# Patient Record
Sex: Female | Born: 1960 | Hispanic: Yes | Marital: Single | Smoking: Never smoker
Health system: Southern US, Community
[De-identification: ages and names within clinical notes are randomized; demographics above are authoritative.]

## PROBLEM LIST (undated history)

## (undated) DIAGNOSIS — E785 Hyperlipidemia, unspecified: Secondary | ICD-10-CM

## (undated) DIAGNOSIS — I10 Essential (primary) hypertension: Secondary | ICD-10-CM

## (undated) DIAGNOSIS — F419 Anxiety disorder, unspecified: Secondary | ICD-10-CM

## (undated) DIAGNOSIS — G43909 Migraine, unspecified, not intractable, without status migrainosus: Secondary | ICD-10-CM

## (undated) HISTORY — DX: Anxiety disorder, unspecified: F41.9

## (undated) HISTORY — DX: Hyperlipidemia, unspecified: E78.5

## (undated) HISTORY — DX: Migraine, unspecified, not intractable, without status migrainosus: G43.909

## (undated) HISTORY — DX: Essential (primary) hypertension: I10

---

## 2017-05-02 ENCOUNTER — Encounter: Payer: Self-pay | Admitting: Internal Medicine

## 2017-05-02 ENCOUNTER — Ambulatory Visit: Payer: Self-pay | Admitting: Internal Medicine

## 2017-05-02 VITALS — BP 150/98 | HR 79 | Resp 16 | Ht 62.0 in | Wt 172.0 lb

## 2017-05-02 DIAGNOSIS — G43909 Migraine, unspecified, not intractable, without status migrainosus: Secondary | ICD-10-CM | POA: Insufficient documentation

## 2017-05-02 DIAGNOSIS — E782 Mixed hyperlipidemia: Secondary | ICD-10-CM

## 2017-05-02 DIAGNOSIS — F419 Anxiety disorder, unspecified: Secondary | ICD-10-CM | POA: Insufficient documentation

## 2017-05-02 DIAGNOSIS — I10 Essential (primary) hypertension: Secondary | ICD-10-CM | POA: Insufficient documentation

## 2017-05-02 DIAGNOSIS — J3089 Other allergic rhinitis: Secondary | ICD-10-CM

## 2017-05-02 MED ORDER — LISINOPRIL 20 MG PO TABS: 20.0000 mg | ORAL_TABLET | Freq: Every day | ORAL | 11 refills | Status: DC

## 2017-05-02 MED ORDER — ESCITALOPRAM OXALATE 10 MG PO TABS: 10.0000 mg | ORAL_TABLET | Freq: Every day | ORAL | 2 refills | Status: DC

## 2017-05-02 MED ORDER — METOPROLOL TARTRATE 25 MG PO TABS: 25.0000 mg | ORAL_TABLET | Freq: Two times a day (BID) | ORAL | 11 refills | Status: DC
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
## 2017-05-03 ENCOUNTER — Encounter: Payer: Self-pay | Admitting: Internal Medicine

## 2017-05-03 NOTE — Progress Notes (Signed)
Patient ID: Markus JarvisCiria Pineda Davalos, female   DOB: 09/24/1960, 57 y.o.   MRN: [id number]  LCSW completed new pt screening with pt. Pt reported that she has multiple depressive and anxious symptoms, including anhedonia, depressed mood, insomnia, fatigue, overeating, and stress. She reported that she has some difficulty with social determinants of health but nothing that is urgent. Tianna scheduled counseling session with LCSW to address mental health concerns.

## 2017-05-11 ENCOUNTER — Other Ambulatory Visit: Payer: Self-pay | Admitting: Licensed Clinical Social Worker

## 2017-05-13 ENCOUNTER — Other Ambulatory Visit: Payer: Self-pay

## 2017-05-13 DIAGNOSIS — Z79899 Other long term (current) drug therapy: Secondary | ICD-10-CM

## 2017-05-13 DIAGNOSIS — E782 Mixed hyperlipidemia: Secondary | ICD-10-CM

## 2017-05-14 LAB — LIPID PANEL W/O CHOL/HDL RATIO
CHOLESTEROL TOTAL: 208 mg/dL — AB (ref 100–199)
HDL: 40 mg/dL (ref >39)
LDL Calculated: 123 mg/dL — ABNORMAL HIGH (ref 0–99)
Triglycerides: 226 mg/dL — ABNORMAL HIGH (ref 0–149)
VLDL CHOLESTEROL CAL: 45 mg/dL — AB (ref 5–40)

## 2017-05-14 LAB — COMPREHENSIVE METABOLIC PANEL
A/G RATIO: 1.8 (ref 1.2–2.2)
ALBUMIN: 4.5 g/dL (ref 3.5–5.5)
ALT: 26 IU/L (ref 0–32)
AST: 29 IU/L (ref 0–40)
Alkaline Phosphatase: 88 IU/L (ref 39–117)
BUN / CREAT RATIO: 14 (ref 9–23)
BUN: 12 mg/dL (ref 6–24)
Bilirubin Total: 0.6 mg/dL (ref 0.0–1.2)
CALCIUM: 9.4 mg/dL (ref 8.7–10.2)
CO2: 22 mmol/L (ref 20–29)
Chloride: 106 mmol/L (ref 96–106)
Creatinine, Ser: 0.84 mg/dL (ref 0.57–1.00)
GFR, EST AFRICAN AMERICAN: 91 mL/min/{1.73_m2} (ref >59)
GFR, EST NON AFRICAN AMERICAN: 79 mL/min/{1.73_m2} (ref >59)
GLOBULIN, TOTAL: 2.5 g/dL (ref 1.5–4.5)
Glucose: 87 mg/dL (ref 65–99)
POTASSIUM: 4.3 mmol/L (ref 3.5–5.2)
SODIUM: 143 mmol/L (ref 134–144)
Total Protein: 7 g/dL (ref 6.0–8.5)

## 2017-05-14 LAB — CBC WITH DIFFERENTIAL/PLATELET
BASOS: 0 %
Basophils Absolute: 0 10*3/uL (ref 0.0–0.2)
EOS (ABSOLUTE): 0.1 10*3/uL (ref 0.0–0.4)
Eos: 2 %
HEMATOCRIT: 40 % (ref 34.0–46.6)
Hemoglobin: 13 g/dL (ref 11.1–15.9)
Immature Grans (Abs): 0 10*3/uL (ref 0.0–0.1)
Immature Granulocytes: 0 %
LYMPHS ABS: 3.5 10*3/uL — AB (ref 0.7–3.1)
Lymphs: 52 %
MCH: 30.2 pg (ref 26.6–33.0)
MCHC: 32.5 g/dL (ref 31.5–35.7)
MCV: 93 fL (ref 79–97)
MONOS ABS: 0.6 10*3/uL (ref 0.1–0.9)
Monocytes: 9 %
NEUTROS ABS: 2.6 10*3/uL (ref 1.4–7.0)
NEUTROS PCT: 37 %
PLATELETS: 233 10*3/uL (ref 150–379)
RBC: 4.3 x10E6/uL (ref 3.77–5.28)
RDW: 14.1 % (ref 12.3–15.4)
WBC: 6.9 10*3/uL (ref 3.4–10.8)

## 2017-05-16 ENCOUNTER — Telehealth: Payer: Self-pay | Admitting: Internal Medicine

## 2017-09-19 ENCOUNTER — Other Ambulatory Visit: Payer: Self-pay

## 2017-09-29 ENCOUNTER — Other Ambulatory Visit: Payer: Self-pay

## 2017-09-29 DIAGNOSIS — E782 Mixed hyperlipidemia: Secondary | ICD-10-CM

## 2017-09-30 LAB — LIPID PANEL W/O CHOL/HDL RATIO
CHOLESTEROL TOTAL: 173 mg/dL (ref 100–199)
HDL: 36 mg/dL — ABNORMAL LOW (ref >39)
LDL Calculated: 97 mg/dL (ref 0–99)
Triglycerides: 202 mg/dL — ABNORMAL HIGH (ref 0–149)
VLDL CHOLESTEROL CAL: 40 mg/dL (ref 5–40)

## 2017-11-11 ENCOUNTER — Other Ambulatory Visit: Payer: Self-pay

## 2017-11-11 ENCOUNTER — Encounter (HOSPITAL_COMMUNITY): Payer: Self-pay | Admitting: Emergency Medicine

## 2017-11-11 ENCOUNTER — Emergency Department (HOSPITAL_COMMUNITY)
Admission: EM | Admit: 2017-11-11 | Discharge: 2017-11-11 | Disposition: A | Discharge status: Home / Self Care | Payer: Self-pay | Attending: Emergency Medicine | Admitting: Emergency Medicine

## 2017-11-11 ENCOUNTER — Emergency Department (HOSPITAL_COMMUNITY): Payer: Self-pay

## 2017-11-11 DIAGNOSIS — J40 Bronchitis, not specified as acute or chronic: Secondary | ICD-10-CM | POA: Insufficient documentation

## 2017-11-11 DIAGNOSIS — R079 Chest pain, unspecified: Secondary | ICD-10-CM | POA: Insufficient documentation

## 2017-11-11 DIAGNOSIS — I10 Essential (primary) hypertension: Secondary | ICD-10-CM | POA: Insufficient documentation

## 2017-11-11 DIAGNOSIS — Z79899 Other long term (current) drug therapy: Secondary | ICD-10-CM | POA: Insufficient documentation

## 2017-11-11 DIAGNOSIS — R059 Cough, unspecified: Secondary | ICD-10-CM

## 2017-11-11 DIAGNOSIS — R05 Cough: Secondary | ICD-10-CM

## 2017-11-11 DIAGNOSIS — Z23 Encounter for immunization: Secondary | ICD-10-CM | POA: Insufficient documentation

## 2017-11-11 LAB — BASIC METABOLIC PANEL
Anion gap: 6 (ref 5–15)
BUN: 10 mg/dL (ref 6–20)
CALCIUM: 8.8 mg/dL — AB (ref 8.9–10.3)
CHLORIDE: 110 mmol/L (ref 98–111)
CO2: 22 mmol/L (ref 22–32)
CREATININE: 0.9 mg/dL (ref 0.44–1.00)
Glucose, Bld: 152 mg/dL — ABNORMAL HIGH (ref 70–99)
Potassium: 3.5 mmol/L (ref 3.5–5.1)
SODIUM: 138 mmol/L (ref 135–145)

## 2017-11-11 LAB — I-STAT BETA HCG BLOOD, ED (NOT ORDERABLE)

## 2017-11-11 LAB — CBC
HCT: 37.8 % (ref 36.0–46.0)
Hemoglobin: 13.1 g/dL (ref 12.0–15.0)
MCH: 31.9 pg (ref 26.0–34.0)
MCHC: 34.7 g/dL (ref 30.0–36.0)
MCV: 92 fL (ref 78.0–100.0)
PLATELETS: 195 10*3/uL (ref 150–400)
RBC: 4.11 MIL/uL (ref 3.87–5.11)
RDW: 12.6 % (ref 11.5–15.5)
WBC: 5.9 10*3/uL (ref 4.0–10.5)

## 2017-11-11 LAB — INFLUENZA PANEL BY PCR (TYPE A & B)
Influenza A By PCR: NEGATIVE
Influenza B By PCR: NEGATIVE

## 2017-11-11 LAB — POCT I-STAT TROPONIN I: TROPONIN I, POC: 0 ng/mL (ref 0.00–0.08)

## 2017-11-11 MED ORDER — AZITHROMYCIN 250 MG PO TABS: 250.0000 mg | ORAL_TABLET | Freq: Every day | ORAL | 0 refills | Status: DC

## 2017-11-11 MED ORDER — ALBUTEROL SULFATE HFA 108 (90 BASE) MCG/ACT IN AERS
2.0000 | INHALATION_SPRAY | Freq: Once | RESPIRATORY_TRACT | Status: AC
  Administered 2017-11-11: 2 via RESPIRATORY_TRACT
  Filled 2017-11-11: qty 6.7

## 2017-11-11 MED ORDER — PREDNISONE 20 MG PO TABS: ORAL_TABLET | ORAL | 0 refills | Status: DC

## 2017-11-11 MED ORDER — AZITHROMYCIN 250 MG PO TABS
500.0000 mg | ORAL_TABLET | Freq: Once | ORAL | Status: AC
  Administered 2017-11-11: 500 mg via ORAL
  Filled 2017-11-11: qty 2

## 2017-11-11 MED ORDER — PREDNISONE 50 MG PO TABS
60.0000 mg | ORAL_TABLET | Freq: Once | ORAL | Status: AC
  Administered 2017-11-11: 60 mg via ORAL
  Filled 2017-11-11: qty 1

## 2017-11-11 NOTE — ED Triage Notes (Signed)
Pt c/o cough since yesterday, pt started having sob/chest pressure and gen weakness today. A/o. Non diaphoretic.

## 2017-11-11 NOTE — ED Notes (Signed)
Signs and symptoms improved after medications. No new complaints or concerns verbalized. Pt pain free. Pt awaiting plan of care.
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
## 2017-11-30 ENCOUNTER — Ambulatory Visit: Payer: Self-pay | Admitting: Internal Medicine

## 2017-11-30 ENCOUNTER — Encounter: Payer: Self-pay | Admitting: Internal Medicine

## 2017-11-30 VITALS — BP 130/90 | HR 86 | Resp 12 | Ht 62.0 in | Wt 180.0 lb

## 2017-11-30 DIAGNOSIS — R05 Cough: Secondary | ICD-10-CM

## 2017-11-30 DIAGNOSIS — I10 Essential (primary) hypertension: Secondary | ICD-10-CM

## 2017-11-30 DIAGNOSIS — F419 Anxiety disorder, unspecified: Secondary | ICD-10-CM

## 2017-11-30 DIAGNOSIS — R059 Cough, unspecified: Secondary | ICD-10-CM

## 2017-11-30 MED ORDER — ESCITALOPRAM OXALATE 10 MG PO TABS: ORAL_TABLET | ORAL | 2 refills | Status: DC

## 2017-11-30 MED ORDER — LOSARTAN POTASSIUM 100 MG PO TABS: 100.0000 mg | ORAL_TABLET | Freq: Every day | ORAL | 11 refills | Status: DC

## 2017-11-30 MED ORDER — METOPROLOL TARTRATE 25 MG PO TABS: 25.0000 mg | ORAL_TABLET | Freq: Two times a day (BID) | ORAL | 0 refills | Status: DC

## 2017-11-30 NOTE — Progress Notes (Signed)
   Subjective:    Patient ID: Vanessa Mcdaniel, female    DOB: 17-Jan-1961, 57 y.o.   MRN: [id number]  HPI   Daughter interprets--difficult to clarify situation, so asked Trinda Pascal to help out.  1.  Patient seen in ED at [facility] in [place] on 11/11/2017 for cough.  Was diagnosed with acute bronchitis and treated with prednisone burst and Zpack. Chest Xray showed cardiomegaly, but no pulmonary abnormality.   Took Prednisone only for 2 days as she felt it made her dizzy and finished the Zpack.  Not clear, but sounds like she felt she had a fever with this illness.  Patient states she now has what sound like paroxysms of cough.  Difficulty clarifying, but sounds like she had this about a month before her illness that took her to the ED.  This is the same cough she is now having since treatment for her bronchitis.   She has been taking Lisinopril for some time, I refilled in March.  2.  Panic Disorder: did not followup after start of Escitalopram, started in March as well.  States it made her sleepy, so she started taking at bedtime.  Stopped taking after first month.  States she was afraid she would come to depend on it for sleep.  Was on sleep medicine in past for many years.   She is still having problems with anxiety, she cannot say if that is more of her problem than panic disorder.  Current Meds  Medication Sig  . [DISCONTINUED] lisinopril (PRINIVIL,ZESTRIL) 20 MG tablet Take 1 tablet (20 mg total) by mouth daily.   No Known Allergies      Review of Systems     Objective:   Physical Exam  NAD HEENT:  PERRL, EOMI, TMs pearly gray--points to pain behind right ear--tender over right sternocleidomastoid.  Throat without injection Neck:  Supple, No adenopathy.  Chest:  CTA CV:  RRR without murmur or rub.  Radial and DP pulses normal and equal. LE:  No edema.        Assessment & Plan:  1.  Cough:  Appears to be ACE I cough and she had a separate respiratory illness  at beginning of month.  Stop Lisinopril and switch to Losartan.  2.  Hypertension:  Switch to Losartan 100 mg daily.  Follow up in 2 weeks for bp, cough and anxiety.  Check BMP then.  3.  Anxiety/panic disorder and suspect depression as well:  Restart Escitalopram 10 mg at bedtime.  Follow up as above.

## 2017-12-14 ENCOUNTER — Encounter: Payer: Self-pay | Admitting: Internal Medicine

## 2017-12-14 ENCOUNTER — Ambulatory Visit: Payer: Self-pay | Admitting: Internal Medicine

## 2017-12-14 VITALS — BP 130/82 | HR 82 | Resp 12 | Ht 62.0 in | Wt 180.0 lb

## 2017-12-14 DIAGNOSIS — I10 Essential (primary) hypertension: Secondary | ICD-10-CM

## 2017-12-14 DIAGNOSIS — R059 Cough, unspecified: Secondary | ICD-10-CM

## 2017-12-14 DIAGNOSIS — F419 Anxiety disorder, unspecified: Secondary | ICD-10-CM

## 2017-12-14 DIAGNOSIS — R739 Hyperglycemia, unspecified: Secondary | ICD-10-CM

## 2017-12-14 DIAGNOSIS — R05 Cough: Secondary | ICD-10-CM

## 2017-12-14 DIAGNOSIS — R0789 Other chest pain: Secondary | ICD-10-CM

## 2017-12-14 MED ORDER — NITROGLYCERIN 0.4 MG SL SUBL: 0.4000 mg | SUBLINGUAL_TABLET | SUBLINGUAL | 0 refills | Status: DC | PRN

## 2017-12-14 MED ORDER — METOPROLOL TARTRATE 25 MG PO TABS: ORAL_TABLET | ORAL | 11 refills | Status: DC

## 2017-12-14 MED ORDER — ASPIRIN EC 81 MG PO TBEC: 81.0000 mg | DELAYED_RELEASE_TABLET | Freq: Every day | ORAL | Status: AC

## 2017-12-14 NOTE — Progress Notes (Signed)
   Subjective:    Patient ID: Vanessa Mcdaniel, female    DOB: 1961-01-13, 57 y.o.   MRN: [id number]  HPI   1.  Cough:  Stopped Lisinopril and is tolerating the Losartan just fine.  Cough much better starting yesterday.  2.  Hypertension:  Improved control on Losartan.    3.  Anxiety/panic disorder:  Taking Escitalopram 10 mg for about 2 weeks.  Has not noted much of a change with anxiety.  Has helped with sleep.  Takes prior to bedtime.  Has felt more rested in the morning.  States she really doesn't have episodes where she is having attacks of anxiety, just always feels anxious.  4.  Episode 2 p.m today with left flank and thoracic back pain that radiated up into her left chest, right chin, not the left and left arm.  She was sweeping with a broom and dustpan.   Was standing straight, felt the pain in her left back that felt like a pressure.  Unable to describe the pain into her chest--maybe also a pressure and right chin. Her left arm just felt heavy, not painful.   Discomfort lasted 5 minutes.  Took an aspirin and drank water and just kept moving her left hand.   No associated dyspnea.   After went away, felt normal.  Was just nervous because she wasn't sure what it was.  She was able to return to her sweeping after resolved.  She continued to work cooking and cleaning afterward.  She was quite busy afterward and the discomfort did not recur.    Current Meds  Medication Sig  . escitalopram (LEXAPRO) 10 MG tablet 1 tab by mouth at bedtime  . losartan (COZAAR) 100 MG tablet Take 1 tablet (100 mg total) by mouth daily.  . metoprolol tartrate (LOPRESSOR) 25 MG tablet Take 1 tablet (25 mg total) by mouth 2 (two) times daily.    No Known Allergies Review of Systems     Objective:   Physical Exam NAD HEENT: PERRL, EOMI, TMs pearly gray, throat without injection Neck:  Supple, No adenopathy, No thyromegaly Chest:  CTA.  NT on palpation of left back and chest. CV:  RRR with normal S1  and S2, No S3, S4 or murmur.  No carotid bruits.  Carotid, radial and DP pulses normal and equal LE:  No edema.  ECG:  No changes from 1 month ago.  Essentially normal.  No ischemic changes in particular.     Assessment & Plan:  1.  Anxiety:  History more of a generalized anxiety today.  Continue Escitalopram at 10 mg daily and discussed may be 6 weeks before she notes a difference, though suspect will need a 20 mg dosing ultimately.  2.  ACE I cough:  Sounds resolved.  3.  Hypertension:  Okay with switch to Losartan 100 mg daily.  Continues also on Metoprolol 25 mg twice daily--not clear if she is taking the latter regularly as her pulse is unchanged.  4.  Chest discomfort:  By history, unlikely cardiac, but she does have risk factors.  No family history of Cardiac disease per patient. ECG unremarkable. Cardiac enzymes NTG SL as needed ASA 81 mg daily. Encouraged her not to miss her Metoprolol twice daily. If normal findings, followup in 1 week for bp and pulse and progress report.  5.  Hyperglycemia:  A1C

## 2017-12-14 NOTE — Patient Instructions (Signed)
For nitroglycerin:  May take 1 tab under tongue every 5 minutes for chest pain.  Do not repeat if chest pain resolves, but may take up to 3 doses 5 minutes apart if the pain does not (this includes the chest pressure type pain)

## 2017-12-15 LAB — CK TOTAL AND CKMB (NOT AT ARMC)
CK MB INDEX: 1.3 ng/mL (ref 0.0–5.3)
Total CK: 174 U/L — ABNORMAL HIGH (ref 24–173)

## 2017-12-15 LAB — HGB A1C W/O EAG: Hgb A1c MFr Bld: 5.3 % (ref 4.8–5.6)

## 2018-01-11 ENCOUNTER — Ambulatory Visit: Payer: Self-pay | Admitting: Internal Medicine

## 2018-02-05 ENCOUNTER — Encounter: Payer: Self-pay | Admitting: Internal Medicine

## 2018-02-06 ENCOUNTER — Ambulatory Visit: Payer: Self-pay | Admitting: Internal Medicine

## 2018-02-15 ENCOUNTER — Ambulatory Visit: Payer: Self-pay | Admitting: Internal Medicine

## 2018-03-11 ENCOUNTER — Encounter: Payer: Self-pay | Admitting: Internal Medicine

## 2018-04-12 ENCOUNTER — Encounter: Payer: Self-pay | Admitting: Internal Medicine

## 2018-04-12 ENCOUNTER — Ambulatory Visit (INDEPENDENT_AMBULATORY_CARE_PROVIDER_SITE_OTHER): Payer: Self-pay | Admitting: Internal Medicine

## 2018-04-12 VITALS — BP 130/86 | HR 82 | Temp 97.8°F | Resp 14 | Ht 62.0 in | Wt 186.0 lb

## 2018-04-12 DIAGNOSIS — F419 Anxiety disorder, unspecified: Secondary | ICD-10-CM

## 2018-04-12 DIAGNOSIS — R059 Cough, unspecified: Secondary | ICD-10-CM

## 2018-04-12 DIAGNOSIS — J3089 Other allergic rhinitis: Secondary | ICD-10-CM

## 2018-04-12 DIAGNOSIS — R05 Cough: Secondary | ICD-10-CM

## 2018-04-12 DIAGNOSIS — K219 Gastro-esophageal reflux disease without esophagitis: Secondary | ICD-10-CM

## 2018-04-12 MED ORDER — FAMOTIDINE 20 MG PO TABS: ORAL_TABLET | ORAL | 4 refills | Status: DC

## 2018-04-12 MED ORDER — FEXOFENADINE HCL 180 MG PO TABS: 180.0000 mg | ORAL_TABLET | Freq: Every day | ORAL | 11 refills | Status: DC

## 2018-04-12 NOTE — Patient Instructions (Signed)
Vanessa Mcdaniel

## 2018-04-12 NOTE — Progress Notes (Signed)
    Subjective:    Patient ID: Vanessa Mcdaniel, female   DOB: Jun 27, 1960, 58 y.o.   MRN: [id number]   HPI   1.  Cough:  Not clear her cough noted in October completely resolved in November.   She states the majority of the cough did resolve, but did not completely go away.   Though was related to Lisinopril or ACE I cough.  Now having episodes of mild and short lived cough.  Occurs daily, generally once daily.   Lasts 10-15 seconds and seems to come from a tickle in her throat. If she laughs or eats certain foods, will cough.  Cheese in particular seems to trigger.   Prior to the cough starting 5 months ago, cheese never bothered her before. She does not note the cough with other forms of dairy, such as ice cream.  She does not drink milk.    She has been taking what sounds like Allegra for red itchy eyes.  Has been taking once weekly for about 2 years.   She feels it helps the eye symptoms. Not much in way of itchy watery nose.  No sneezing.  No itching of her throat.    She does have acid in her throat and burning in her chest daily.  She does not smoke, drink alcohol, eat a lot of tomatoes or onions.  She does drink hot cocoa daily. She works out on treadmill daily for about 15-20 minutes.  She remains stressed and anxious.  Describes having had a lot of difficulties in her past.  Is not undergoing counseling.  On lexapro  Current Meds  Medication Sig  . aspirin EC 81 MG tablet Take 1 tablet (81 mg total) by mouth daily.  . calcium carbonate (TUMS - DOSED IN MG ELEMENTAL CALCIUM) 500 MG chewable tablet Chew 1 tablet by mouth daily.  Marland Kitchen escitalopram (LEXAPRO) 10 MG tablet 1 tab by mouth at bedtime  . losartan (COZAAR) 100 MG tablet Take 1 tablet (100 mg total) by mouth daily.  . metoprolol tartrate (LOPRESSOR) 25 MG tablet 1 1/2 tabs by mouth twice daily  . nitroGLYCERIN (NITROSTAT) 0.4 MG SL tablet Place 1 tablet (0.4 mg total) under the tongue every 5 (five) minutes as needed  for chest pain.   No Known Allergies   Review of Systems    Objective:   BP 130/86 (BP Location: Left Arm, Patient Position: Sitting, Cuff Size: Large)   Pulse 82   Temp 97.8 F (36.6 C) (Oral)   Resp 14   Ht 5\' 2"  (1.575 m)   Wt 186 lb (84.4 kg)   BMI 34.02 kg/m   Physical Exam NAD HEENT:  PERRL, EOMI, conjunctivae without injection.  TMs pearly gray, throat difficult to see adequately.  Nasal mucosa boggy with clear discharge.  Bilaterally tender over frontal and maxillary sinuses Neck:  Supple, No adenopathy, no thyromegaly Chest:  CTA CV:  RRR without murmur, radial and DP pulses normal and equal Abd:  S, NT, No HSM or mass, + BS LE:  No edema  Assessment & Plan   1.  Allergies:  To take Fexofenadine 180 mg daily.  2.  GERD:  Also and element by history of her cough:  Famotidine 40 mg at bedtime.  GERD precautions discussed, elevate HOB  3.  Anxiety:  Referral to [facility].

## 2018-05-12 ENCOUNTER — Other Ambulatory Visit: Payer: Self-pay | Admitting: Internal Medicine

## 2018-05-17 ENCOUNTER — Other Ambulatory Visit: Payer: Self-pay | Admitting: Internal Medicine

## 2018-06-12 NOTE — Telephone Encounter (Signed)
error 

## 2018-06-14 ENCOUNTER — Ambulatory Visit: Payer: Self-pay | Admitting: Internal Medicine

## 2018-06-15 ENCOUNTER — Other Ambulatory Visit: Payer: Self-pay

## 2018-06-15 ENCOUNTER — Telehealth: Payer: Self-pay | Admitting: Internal Medicine

## 2018-06-15 MED ORDER — METOPROLOL TARTRATE 25 MG PO TABS: ORAL_TABLET | ORAL | 11 refills | Status: DC

## 2018-06-19 NOTE — Telephone Encounter (Signed)
error 

## 2018-07-25 ENCOUNTER — Ambulatory Visit: Payer: Self-pay | Admitting: Internal Medicine

## 2018-10-03 ENCOUNTER — Other Ambulatory Visit: Payer: Self-pay

## 2018-10-03 ENCOUNTER — Emergency Department (HOSPITAL_COMMUNITY)
Admission: EM | Admit: 2018-10-03 | Discharge: 2018-10-04 | Disposition: A | Discharge status: Home / Self Care | Payer: Self-pay | Attending: Emergency Medicine | Admitting: Emergency Medicine

## 2018-10-03 ENCOUNTER — Encounter (HOSPITAL_COMMUNITY): Payer: Self-pay | Admitting: Emergency Medicine

## 2018-10-03 DIAGNOSIS — I10 Essential (primary) hypertension: Secondary | ICD-10-CM | POA: Insufficient documentation

## 2018-10-03 DIAGNOSIS — Z79899 Other long term (current) drug therapy: Secondary | ICD-10-CM | POA: Insufficient documentation

## 2018-10-03 DIAGNOSIS — R51 Headache: Secondary | ICD-10-CM | POA: Insufficient documentation

## 2018-10-03 DIAGNOSIS — R519 Headache, unspecified: Secondary | ICD-10-CM

## 2018-10-03 DIAGNOSIS — Z7982 Long term (current) use of aspirin: Secondary | ICD-10-CM | POA: Insufficient documentation

## 2018-10-03 NOTE — ED Triage Notes (Signed)
Pt arrives to ED after being referred to come here by [facility]. Per note pt was sent here to have neuro consult and possible MR to r/o optic neuritis. Pt has left eye pain with burning and watery for 2 days.

## 2018-10-04 ENCOUNTER — Other Ambulatory Visit: Payer: Self-pay

## 2018-10-04 ENCOUNTER — Emergency Department (HOSPITAL_COMMUNITY): Payer: Self-pay

## 2018-10-04 LAB — BASIC METABOLIC PANEL
Anion gap: 11 (ref 5–15)
BUN: 12 mg/dL (ref 6–20)
CO2: 23 mmol/L (ref 22–32)
Calcium: 9.3 mg/dL (ref 8.9–10.3)
Chloride: 105 mmol/L (ref 98–111)
Creatinine, Ser: 0.82 mg/dL (ref 0.44–1.00)
GFR calc Af Amer: >60 mL/min (ref >60)
GFR calc non Af Amer: >60 mL/min (ref >60)
Glucose, Bld: 108 mg/dL — ABNORMAL HIGH (ref 70–99)
Potassium: 3.8 mmol/L (ref 3.5–5.1)
Sodium: 139 mmol/L (ref 135–145)

## 2018-10-04 LAB — CBC WITH DIFFERENTIAL/PLATELET
Abs Immature Granulocytes: 0.03 10*3/uL (ref 0.00–0.07)
Basophils Absolute: 0 10*3/uL (ref 0.0–0.1)
Basophils Relative: 0 %
Eosinophils Absolute: 0.1 10*3/uL (ref 0.0–0.5)
Eosinophils Relative: 1 %
HCT: 38.4 % (ref 36.0–46.0)
Hemoglobin: 13.5 g/dL (ref 12.0–15.0)
Immature Granulocytes: 0 %
Lymphocytes Relative: 27 %
Lymphs Abs: 2.6 10*3/uL (ref 0.7–4.0)
MCH: 31.8 pg (ref 26.0–34.0)
MCHC: 35.2 g/dL (ref 30.0–36.0)
MCV: 90.6 fL (ref 80.0–100.0)
Monocytes Absolute: 1 10*3/uL (ref 0.1–1.0)
Monocytes Relative: 10 %
Neutro Abs: 5.9 10*3/uL (ref 1.7–7.7)
Neutrophils Relative %: 62 %
Platelets: 212 10*3/uL (ref 150–400)
RBC: 4.24 MIL/uL (ref 3.87–5.11)
RDW: 12.4 % (ref 11.5–15.5)
WBC: 9.5 10*3/uL (ref 4.0–10.5)
nRBC: 0 % (ref 0.0–0.2)

## 2018-10-04 LAB — I-STAT BETA HCG BLOOD, ED (MC, WL, AP ONLY): I-stat hCG, quantitative: <5 m[IU]/mL (ref <5)

## 2018-10-04 LAB — SEDIMENTATION RATE: Sed Rate: 6 mm/hr (ref 0–22)

## 2018-10-04 MED ORDER — DIPHENHYDRAMINE HCL 50 MG/ML IJ SOLN
25.0000 mg | Freq: Once | INTRAMUSCULAR | Status: AC
  Administered 2018-10-04: 03:00:00 25 mg via INTRAVENOUS
  Filled 2018-10-04: qty 1

## 2018-10-04 MED ORDER — LORAZEPAM 2 MG/ML IJ SOLN
1.0000 mg | Freq: Once | INTRAMUSCULAR | Status: AC
  Administered 2018-10-04: 03:00:00 1 mg via INTRAVENOUS
  Filled 2018-10-04: qty 1

## 2018-10-04 MED ORDER — DIPHENHYDRAMINE HCL 50 MG/ML IJ SOLN
25.0000 mg | Freq: Once | INTRAMUSCULAR | Status: AC
  Administered 2018-10-04: 02:00:00 25 mg via INTRAVENOUS
  Filled 2018-10-04: qty 1

## 2018-10-04 MED ORDER — GADOBUTROL 1 MMOL/ML IV SOLN
8.0000 mL | Freq: Once | INTRAVENOUS | Status: AC | PRN
  Administered 2018-10-04: 05:00:00 8 mL via INTRAVENOUS

## 2018-10-04 MED ORDER — SODIUM CHLORIDE 0.9 % IV BOLUS
1000.0000 mL | Freq: Once | INTRAVENOUS | Status: AC
  Administered 2018-10-04: 02:00:00 1000 mL via INTRAVENOUS

## 2018-10-04 MED ORDER — METOCLOPRAMIDE HCL 5 MG/ML IJ SOLN
10.0000 mg | Freq: Once | INTRAMUSCULAR | Status: AC
  Administered 2018-10-04: 02:00:00 10 mg via INTRAVENOUS
  Filled 2018-10-04: qty 2

## 2018-10-04 MED ORDER — MORPHINE SULFATE (PF) 4 MG/ML IV SOLN
4.0000 mg | Freq: Once | INTRAVENOUS | Status: AC
  Administered 2018-10-04: 4 mg via INTRAVENOUS
  Filled 2018-10-04: qty 1

## 2018-10-04 MED ORDER — DEXAMETHASONE SODIUM PHOSPHATE 10 MG/ML IJ SOLN
10.0000 mg | Freq: Once | INTRAMUSCULAR | Status: AC
  Administered 2018-10-04: 06:00:00 10 mg via INTRAVENOUS
  Filled 2018-10-04: qty 1

## 2018-10-04 MED ORDER — PROCHLORPERAZINE EDISYLATE 10 MG/2ML IJ SOLN
10.0000 mg | Freq: Once | INTRAMUSCULAR | Status: AC
  Administered 2018-10-04: 06:00:00 10 mg via INTRAVENOUS
  Filled 2018-10-04: qty 2

## 2018-10-04 NOTE — ED Notes (Signed)
Patient transported to MRI 
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
## 2018-10-14 ENCOUNTER — Other Ambulatory Visit: Payer: Self-pay

## 2018-10-14 ENCOUNTER — Encounter (HOSPITAL_COMMUNITY): Payer: Self-pay | Admitting: *Deleted

## 2018-10-14 ENCOUNTER — Emergency Department (HOSPITAL_COMMUNITY)
Admission: EM | Admit: 2018-10-14 | Discharge: 2018-10-15 | Disposition: A | Discharge status: Home / Self Care | Payer: Self-pay | Attending: Emergency Medicine | Admitting: Emergency Medicine

## 2018-10-14 DIAGNOSIS — R519 Headache, unspecified: Secondary | ICD-10-CM

## 2018-10-14 DIAGNOSIS — Z79899 Other long term (current) drug therapy: Secondary | ICD-10-CM | POA: Insufficient documentation

## 2018-10-14 DIAGNOSIS — R51 Headache: Secondary | ICD-10-CM | POA: Insufficient documentation

## 2018-10-14 DIAGNOSIS — I10 Essential (primary) hypertension: Secondary | ICD-10-CM | POA: Insufficient documentation

## 2018-10-14 DIAGNOSIS — Z7982 Long term (current) use of aspirin: Secondary | ICD-10-CM | POA: Insufficient documentation

## 2018-10-14 MED ORDER — TETRACAINE HCL 0.5 % OP SOLN
2.0000 [drp] | Freq: Once | OPHTHALMIC | Status: AC
  Administered 2018-10-15: 2 [drp] via OPHTHALMIC
  Filled 2018-10-14: qty 4

## 2018-10-14 MED ORDER — FLUORESCEIN SODIUM 1 MG OP STRP
1.0000 | ORAL_STRIP | Freq: Once | OPHTHALMIC | Status: AC
  Administered 2018-10-15: 01:00:00 1 via OPHTHALMIC
  Filled 2018-10-14: qty 1

## 2018-10-14 NOTE — ED Triage Notes (Signed)
Pt returns to er for continued  pain to eye, pt states that is has been bothering her for several weeks, recently seen in er for the same,

## 2018-10-15 ENCOUNTER — Emergency Department (HOSPITAL_COMMUNITY): Payer: Self-pay

## 2018-10-15 LAB — COMPREHENSIVE METABOLIC PANEL
ALT: 28 U/L (ref 0–44)
AST: 30 U/L (ref 15–41)
Albumin: 4.1 g/dL (ref 3.5–5.0)
Alkaline Phosphatase: 101 U/L (ref 38–126)
Anion gap: 8 (ref 5–15)
BUN: 14 mg/dL (ref 6–20)
CO2: 24 mmol/L (ref 22–32)
Calcium: 9.4 mg/dL (ref 8.9–10.3)
Chloride: 108 mmol/L (ref 98–111)
Creatinine, Ser: 0.85 mg/dL (ref 0.44–1.00)
GFR calc Af Amer: >60 mL/min (ref >60)
GFR calc non Af Amer: >60 mL/min (ref >60)
Glucose, Bld: 112 mg/dL — ABNORMAL HIGH (ref 70–99)
Potassium: 4.1 mmol/L (ref 3.5–5.1)
Sodium: 140 mmol/L (ref 135–145)
Total Bilirubin: 0.2 mg/dL — ABNORMAL LOW (ref 0.3–1.2)
Total Protein: 7.5 g/dL (ref 6.5–8.1)

## 2018-10-15 LAB — CBC WITH DIFFERENTIAL/PLATELET
Abs Immature Granulocytes: 0.06 10*3/uL (ref 0.00–0.07)
Basophils Absolute: 0.1 10*3/uL (ref 0.0–0.1)
Basophils Relative: 0 %
Eosinophils Absolute: 0.1 10*3/uL (ref 0.0–0.5)
Eosinophils Relative: 1 %
HCT: 37.9 % (ref 36.0–46.0)
Hemoglobin: 13 g/dL (ref 12.0–15.0)
Immature Granulocytes: 1 %
Lymphocytes Relative: 28 %
Lymphs Abs: 3.2 10*3/uL (ref 0.7–4.0)
MCH: 31.6 pg (ref 26.0–34.0)
MCHC: 34.3 g/dL (ref 30.0–36.0)
MCV: 92.2 fL (ref 80.0–100.0)
Monocytes Absolute: 1 10*3/uL (ref 0.1–1.0)
Monocytes Relative: 9 %
Neutro Abs: 7.2 10*3/uL (ref 1.7–7.7)
Neutrophils Relative %: 61 %
Platelets: 277 10*3/uL (ref 150–400)
RBC: 4.11 MIL/uL (ref 3.87–5.11)
RDW: 12.2 % (ref 11.5–15.5)
WBC: 11.7 10*3/uL — ABNORMAL HIGH (ref 4.0–10.5)
nRBC: 0 % (ref 0.0–0.2)

## 2018-10-15 LAB — SEDIMENTATION RATE: Sed Rate: 14 mm/hr (ref 0–22)

## 2018-10-15 MED ORDER — METOCLOPRAMIDE HCL 5 MG/ML IJ SOLN
10.0000 mg | Freq: Once | INTRAMUSCULAR | Status: AC
  Administered 2018-10-15: 10 mg via INTRAVENOUS
  Filled 2018-10-15: qty 2

## 2018-10-15 MED ORDER — KETOROLAC TROMETHAMINE 30 MG/ML IJ SOLN
30.0000 mg | Freq: Once | INTRAMUSCULAR | Status: AC
  Administered 2018-10-15: 01:00:00 30 mg via INTRAVENOUS
  Filled 2018-10-15: qty 1

## 2018-10-15 MED ORDER — DIPHENHYDRAMINE HCL 50 MG/ML IJ SOLN
25.0000 mg | Freq: Once | INTRAMUSCULAR | Status: AC
  Administered 2018-10-15: 01:00:00 25 mg via INTRAVENOUS
  Filled 2018-10-15: qty 1

## 2018-10-15 MED ORDER — AMOXICILLIN 500 MG PO CAPS: 500.0000 mg | ORAL_CAPSULE | Freq: Three times a day (TID) | ORAL | 0 refills | Status: DC

## 2018-10-15 MED ORDER — IOHEXOL 300 MG/ML  SOLN
75.0000 mL | Freq: Once | INTRAMUSCULAR | Status: AC | PRN
  Administered 2018-10-15: 75 mL via INTRAVENOUS

## 2018-10-15 NOTE — Discharge Instructions (Addendum)
Your testing is reassuring.  You were given antibiotics for possible sinus inflammation.  Follow-up with the neurologist for further investigation of the cause of your headache as well as with your eye doctor.  Return to the ED with worsening headache, fever, vomiting, unilateral weakness or numbness or any other concerns.

## 2018-10-15 NOTE — ED Notes (Signed)
Eye drops had already been applied to left eye prior to visual acuity.
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
## 2018-10-15 NOTE — ED Notes (Signed)
ED Provider at bedside. 

## 2018-10-18 ENCOUNTER — Encounter: Payer: Self-pay | Admitting: Internal Medicine

## 2018-10-18 ENCOUNTER — Ambulatory Visit (INDEPENDENT_AMBULATORY_CARE_PROVIDER_SITE_OTHER): Payer: Self-pay | Admitting: Internal Medicine

## 2018-10-18 ENCOUNTER — Other Ambulatory Visit: Payer: Self-pay

## 2018-10-18 VITALS — BP 144/98 | HR 74 | Resp 12 | Ht 62.0 in | Wt 185.0 lb

## 2018-10-18 DIAGNOSIS — J320 Chronic maxillary sinusitis: Secondary | ICD-10-CM

## 2018-10-18 MED ORDER — TRAMADOL HCL 50 MG PO TABS: ORAL_TABLET | ORAL | 0 refills | Status: DC

## 2018-10-18 MED ORDER — DOXYCYCLINE HYCLATE 100 MG PO TABS: ORAL_TABLET | ORAL | 0 refills | Status: DC

## 2018-10-18 MED ORDER — PREDNISONE 10 MG PO TABS: ORAL_TABLET | ORAL | 0 refills | Status: DC

## 2018-10-18 NOTE — Progress Notes (Signed)
Subjective:    Patient ID: Vanessa Mcdaniel, female   DOB: Nov 19, 1960, 58 y.o.   MRN: [id number]   HPI   1.  Pain behind left eye for about 1 month, but much worse over the past 2 weeks.   Has never had this type of pain before. Teeth, ear, eye, gums on left side hurt with this pain. No photophobia or phonophobia.  Sometimes mild nausea with this headache. ........................Marland Kitchen 8/25/2020Martin Majestic to [place] [facility] due to the pain and sent to ED with concern she needed an MR and Neuro consult for the possibility of optic neuritis.  MR of the brain was normal. Her sed rate was normal.   Was given pain control and told to followup with ophthalmology.   Daughter states they felt this was likely a migraine or cluster headache.  She went back to the ED at [facility] this time on 10/14/2018.  Sed rate again normal as were CT of brain and orbits.   She was noted from previous MR and the CT to have chronic mucosal thickening in left paranasal sinuses. Given Rx for Amoxicillin 500 mg 3 times daily for 7 days on September 5th.  Pain continues and states her gums and behind her left eye hurt the most.  Describes the pain like a toothache, but 10 times worse.  Her scalp and face all hurt on the left.   No fever.   No posterior pharyngeal drainage. She has noted terrible bad breath a few days ago.   Some of the meds given in the ED have helped for a very short period No rash on her scalp or face.    She is no longer taking Lexapro.  She cannot say why she stopped taking.  She thinks it may have been in April.   After call to pharmacy, found out she had not filled Lexapro since 12/01/2017.  She had not filled the medication since 05/15/17 prior to the October fill. She states with the pandemic, she just stopped it--continued on the Losartan and Metoprolol, however.  States she has migraine headaches before, but this does not feel similar to those headaches.  Her migraines would generalize.   She  is unable to describe the difference in her migraine headaches in the past compared to this particular headache.    Current Meds  Medication Sig  . amoxicillin (AMOXIL) 500 MG capsule Take 1 capsule (500 mg total) by mouth 3 (three) times daily.  Marland Kitchen aspirin EC 81 MG tablet Take 1 tablet (81 mg total) by mouth daily.  Marland Kitchen losartan (COZAAR) 100 MG tablet Take 1 tablet (100 mg total) by mouth daily.  . metoprolol tartrate (LOPRESSOR) 25 MG tablet Take 1 1/2 tablets twice daily (Patient taking differently: Take 25 mg by mouth 2 (two) times daily. )   No Known Allergies   Review of Systems    Objective:   BP (!) 144/98 (BP Location: Left Arm, Cuff Size: Normal)   Pulse 74   Resp 12   Ht 5\' 2"  (1.575 m)   Wt 185 lb (83.9 kg)   BMI 33.84 kg/m   Physical Exam  Holding left head No rash or skin lesions on left face. Clears mucous from throat regularly HEENT: PERRL, EOMI, Discs sharp bilaterally Quite tender over left maxillary sinus.  NT over left parotid are. Nasal mucosa without significant swelling and no drainage. Throat without cobbling or erythema NT over edentulous upper left gum. Neck:  Supple,  no adenopathy Chest:  CTA CV:  RRR without murmur or rub.  Assessment & Plan  Left sided head pain:  Treat for more resistant sinusitis. Prednisone 30 mg daily for 7 days Doxycycline 100 mg twice daily for 7 days. Nasal saline. Tramadol 25-50 mg every 8 hours as needed for severe pain. #10 tabs given. Follow up in 10 days.

## 2018-11-15 ENCOUNTER — Ambulatory Visit: Payer: Self-pay | Admitting: Internal Medicine

## 2018-12-12 ENCOUNTER — Other Ambulatory Visit: Payer: Self-pay | Admitting: Internal Medicine

## 2018-12-13 ENCOUNTER — Telehealth: Payer: Self-pay | Admitting: Internal Medicine

## 2018-12-13 NOTE — Telephone Encounter (Signed)
Patient called stating has been having really bad congestion and really bad stuffy nose   x 8 days; patient stated when tries to clear off throat mucus come up with blood in it; also patient stated had eye swollen two days ago.   Patient denied any  Fever, headache or any other Covid19 related symptoms or been exposed to someone suspected or confirmed with it. Also, patient stated none of her family member has been sick with cold or something similar. Patient stated is taking Allegra regularly.  FYI - Patient has appointment scheduled for 01/01/19.  PLEASE ADVISE.

## 2018-12-14 NOTE — Telephone Encounter (Signed)
Patient informed, agreed and verbalized understanding

## 2018-12-14 NOTE — Telephone Encounter (Signed)
Inform patient she should get tested to make sure its not COVID and we can add her to the waiting list and if we get a cancellation we can call her to come in early

## 2019-01-01 ENCOUNTER — Other Ambulatory Visit: Payer: Self-pay

## 2019-01-01 ENCOUNTER — Encounter: Payer: Self-pay | Admitting: Internal Medicine

## 2019-01-01 ENCOUNTER — Ambulatory Visit: Payer: Self-pay | Admitting: Internal Medicine

## 2019-01-01 VITALS — BP 132/90 | HR 84 | Resp 12 | Ht 62.0 in | Wt 192.0 lb

## 2019-01-01 DIAGNOSIS — J320 Chronic maxillary sinusitis: Secondary | ICD-10-CM

## 2019-01-01 DIAGNOSIS — Z23 Encounter for immunization: Secondary | ICD-10-CM

## 2019-01-01 DIAGNOSIS — I10 Essential (primary) hypertension: Secondary | ICD-10-CM

## 2019-01-01 DIAGNOSIS — F419 Anxiety disorder, unspecified: Secondary | ICD-10-CM

## 2019-01-01 DIAGNOSIS — J029 Acute pharyngitis, unspecified: Secondary | ICD-10-CM | POA: Insufficient documentation

## 2019-01-01 MED ORDER — FLUOXETINE HCL 10 MG PO CAPS: 10.0000 mg | ORAL_CAPSULE | Freq: Every day | ORAL | 3 refills | Status: DC

## 2019-01-01 NOTE — Progress Notes (Addendum)
    Subjective:    Patient ID: Vanessa Mcdaniel, female   DOB: 1960-11-02, 58 y.o.   MRN: [id number]   HPI   Interpreted by daughter.  1.  Headache/sinusitis:  Resolved after treatment with prednisone, antibiotics, nasal saline.  States was much better after 4 days of treatment.  2.  Hypertension:  States her bp is always high.  Discussed that is not good, however.  She is not taking her Metoprolol twice daily.  She did not realize she was supposed to take twice.    3.  Anxiety and panic disorder.  Tried Escitalopram about 1 year ago, but patient kept discontinuing.  Stated it made her sleepy, so she stopped,  Current Meds  Medication Sig  . aspirin EC 81 MG tablet Take 1 tablet (81 mg total) by mouth daily.  Marland Kitchen losartan (COZAAR) 100 MG tablet Take 1 tablet by mouth once daily  . metoprolol tartrate (LOPRESSOR) 25 MG tablet Take 1 1/2 tablets twice daily (Patient taking differently: Take 25 mg by mouth 2 (two) times daily. )   No Known Allergies   Review of Systems    Objective:   BP 132/90 (BP Location: Left Arm, Patient Position: Sitting, Cuff Size: Large)   Pulse 84   Resp 12   Ht 5\' 2"  (1.575 m)   Wt 192 lb (87.1 kg)   BMI 35.12 kg/m   Physical Exam  NAD Lungs:  CTA CV:  RRR without murmur or rub  Radial and DP pulses normal and equal LE:  No edema.   Assessment & Plan  1.  Sinusitis:  Resolved issue.  2.  Hypertension:  She was not taking her second dose of Metoprolol--will add the second dosing.  She will continue the Losartan.  3.  HM:  Rx for influenza vaccine.  Tdap today.  4.  Anxiety:  Willing to try Fluoxetine 10 mg daily.  Virtual visit in 1 week.

## 2019-01-04 ENCOUNTER — Other Ambulatory Visit: Payer: Self-pay

## 2019-01-04 ENCOUNTER — Encounter (HOSPITAL_COMMUNITY): Payer: Self-pay | Admitting: *Deleted

## 2019-01-04 ENCOUNTER — Emergency Department (HOSPITAL_COMMUNITY)
Admission: EM | Admit: 2019-01-04 | Discharge: 2019-01-04 | Disposition: A | Discharge status: Home / Self Care | Payer: Self-pay | Attending: Emergency Medicine | Admitting: Emergency Medicine

## 2019-01-04 DIAGNOSIS — R590 Localized enlarged lymph nodes: Secondary | ICD-10-CM | POA: Insufficient documentation

## 2019-01-04 NOTE — ED Triage Notes (Signed)
Painful knot on left side of neck/shoulder area onset today, area is painful when touched

## 2019-01-04 NOTE — ED Notes (Addendum)
Pt does not speak english  She reports she noticed a knot on the base of her neck 2 days ago   Painful when she presses on it   Has physician in [facility] Dr Amil Amen

## 2019-01-04 NOTE — ED Notes (Signed)
Here for an evaluation of a knot to her neck
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
## 2019-01-15 ENCOUNTER — Telehealth: Payer: Self-pay | Admitting: Internal Medicine

## 2019-03-07 ENCOUNTER — Encounter: Payer: Self-pay | Admitting: Internal Medicine

## 2019-03-07 ENCOUNTER — Ambulatory Visit (INDEPENDENT_AMBULATORY_CARE_PROVIDER_SITE_OTHER): Payer: Self-pay | Admitting: Internal Medicine

## 2019-03-07 ENCOUNTER — Other Ambulatory Visit: Payer: Self-pay

## 2019-03-07 VITALS — BP 126/84 | HR 78 | Resp 12 | Ht 62.0 in | Wt 187.0 lb

## 2019-03-07 DIAGNOSIS — M549 Dorsalgia, unspecified: Secondary | ICD-10-CM

## 2019-03-07 DIAGNOSIS — I10 Essential (primary) hypertension: Secondary | ICD-10-CM

## 2019-03-07 DIAGNOSIS — F419 Anxiety disorder, unspecified: Secondary | ICD-10-CM

## 2019-03-07 DIAGNOSIS — G8929 Other chronic pain: Secondary | ICD-10-CM

## 2019-03-07 DIAGNOSIS — G5603 Carpal tunnel syndrome, bilateral upper limbs: Secondary | ICD-10-CM

## 2019-03-07 DIAGNOSIS — Z8739 Personal history of other diseases of the musculoskeletal system and connective tissue: Secondary | ICD-10-CM

## 2019-03-07 MED ORDER — MELOXICAM 7.5 MG PO TABS: ORAL_TABLET | ORAL | 6 refills | Status: DC
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
## 2019-03-07 NOTE — Patient Instructions (Signed)
Buy a cock up splint for both hands and wear to sleep nightly

## 2019-03-18 ENCOUNTER — Emergency Department (HOSPITAL_COMMUNITY)
Admission: EM | Admit: 2019-03-18 | Discharge: 2019-03-18 | Disposition: A | Discharge status: Home / Self Care | Payer: Self-pay | Attending: Emergency Medicine | Admitting: Emergency Medicine

## 2019-03-18 ENCOUNTER — Encounter (HOSPITAL_COMMUNITY): Payer: Self-pay

## 2019-03-18 ENCOUNTER — Other Ambulatory Visit: Payer: Self-pay

## 2019-03-18 ENCOUNTER — Emergency Department (HOSPITAL_COMMUNITY): Payer: Self-pay

## 2019-03-18 DIAGNOSIS — Z7982 Long term (current) use of aspirin: Secondary | ICD-10-CM | POA: Insufficient documentation

## 2019-03-18 DIAGNOSIS — I10 Essential (primary) hypertension: Secondary | ICD-10-CM | POA: Insufficient documentation

## 2019-03-18 DIAGNOSIS — M5412 Radiculopathy, cervical region: Secondary | ICD-10-CM | POA: Insufficient documentation

## 2019-03-18 DIAGNOSIS — H1132 Conjunctival hemorrhage, left eye: Secondary | ICD-10-CM | POA: Insufficient documentation

## 2019-03-18 DIAGNOSIS — Z79899 Other long term (current) drug therapy: Secondary | ICD-10-CM | POA: Insufficient documentation

## 2019-03-18 MED ORDER — PREDNISONE 10 MG PO TABS: ORAL_TABLET | ORAL | 0 refills | Status: DC

## 2019-03-18 MED ORDER — METHOCARBAMOL 500 MG PO TABS: 500.0000 mg | ORAL_TABLET | Freq: Three times a day (TID) | ORAL | 0 refills | Status: DC

## 2019-03-18 MED ORDER — METHOCARBAMOL 500 MG PO TABS
500.0000 mg | ORAL_TABLET | Freq: Once | ORAL | Status: AC
  Administered 2019-03-18: 500 mg via ORAL
  Filled 2019-03-18: qty 1

## 2019-03-18 NOTE — ED Notes (Signed)
TT in to assess 

## 2019-03-18 NOTE — ED Notes (Signed)
Here for chronic neck pain   Also for blood in sclera of eye

## 2019-03-18 NOTE — Discharge Instructions (Addendum)
Alternate with ice and heat packs on and off to your neck.  Take medication as directed.  Follow-up with Dr. Delrae Alfred 3 to 4 days if not improving.  The redness in your eye should improve, but may take a few weeks.  Try applying an over-the-counter lubricating eyedrop.  You may purchase these at the drugstore or [employer].  Return to the ER if you develop worsening symptoms, such as visual changes or pain to your eye.
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
## 2019-03-18 NOTE — ED Triage Notes (Addendum)
Pt presents to ED with complaints of left eye redness started this am. Pt denies any vision loss. Pt also c/o neck pain at the base of her neck, described as heaviness. Pt denies headache, nausea, vomiting or fever. Pt states she has headache but took "some pills" and it is gone now.

## 2019-06-07 ENCOUNTER — Encounter: Payer: Self-pay | Admitting: Internal Medicine

## 2019-06-27 ENCOUNTER — Other Ambulatory Visit: Payer: Self-pay | Admitting: Internal Medicine

## 2019-08-09 ENCOUNTER — Other Ambulatory Visit: Payer: Self-pay | Admitting: Internal Medicine

## 2019-09-04 ENCOUNTER — Encounter: Payer: Self-pay | Admitting: Internal Medicine

## 2019-10-01 ENCOUNTER — Other Ambulatory Visit: Payer: Self-pay | Admitting: Internal Medicine

## 2019-11-13 ENCOUNTER — Ambulatory Visit: Payer: Self-pay | Admitting: Internal Medicine

## 2019-11-13 ENCOUNTER — Encounter: Payer: Self-pay | Admitting: Internal Medicine

## 2019-11-13 VITALS — BP 128/76 | HR 68 | Resp 12 | Ht 62.0 in | Wt 188.5 lb

## 2019-11-13 DIAGNOSIS — M545 Low back pain, unspecified: Secondary | ICD-10-CM

## 2019-11-13 DIAGNOSIS — G8929 Other chronic pain: Secondary | ICD-10-CM

## 2019-11-13 DIAGNOSIS — M2550 Pain in unspecified joint: Secondary | ICD-10-CM

## 2019-11-13 MED ORDER — DULOXETINE HCL 30 MG PO CPEP: ORAL_CAPSULE | ORAL | 3 refills | Status: AC

## 2019-11-13 NOTE — Progress Notes (Signed)
Subjective:    Patient ID: Vanessa Mcdaniel, female   DOB: [birth year], 59 y.o.   MRN: [id number]   HPI   Pt. Scheduled for CPE today, but would rather address what she feels is an arthritis issue from which she is suffering currently.  Points to bilateral ankles and proximal dorsal feet. States she fractured both ankles about 15 years ago when living in [place]. Describes breaking bones in ankle or foot of right side and then about 10 years ago in the left foot/ankle.  Both times she wore an orthopedic boot.  Was off her foot each time for about 2 weeks.  Was barefoot each time and twisted her ankle with inversion of plantar foot. Was told her foot/ankles healed appropriately. States she has not had recurrent pain with the ankles and feet --the pain just started a couple of weeks ago.  Then describes total body pain.  Feels this has been a problem for 6 months.  Points to all joints as sources of pain:  Her head, her back, shoulders, elbows, hips, hands, knees.   Had xrays of cervical spine 03/18/2019 which was normal and sed rate 09/2018 and 10/2018 that was normal both times at 6 and 14.   Has frequent, chronic headache--mainly right frontal, but also bilateral frontal and occipital.    Morning stiffness lasting about 1 hour--hands.  Shoulders and hips perhaps stiff when gets up after sleep or sitting for periods of time.  Takes about 1/2 to 1 hour for stiffness to improve as she moves around.  No rashes.  No problems with dry eyes or mouth.    No problems swallowing.    Poor sleep and chronic fatigue issues.  Current Meds  Medication Sig  . aspirin EC 81 MG tablet Take 1 tablet (81 mg total) by mouth daily.  Marland Kitchen losartan (COZAAR) 100 MG tablet Take 1 tablet by mouth once daily  . metoprolol tartrate (LOPRESSOR) 25 MG tablet TAKE 1 & 1/2 (ONE & ONE-HALF) TABLETS BY MOUTH TWICE DAILY   No Known Allergies   Review of Systems    Objective:   BP 128/76 (BP Location: Left  Arm, Patient Position: Sitting, Cuff Size: Normal)   Pulse 68   Resp 12   Ht 5\' 2"  (1.575 m)   Wt 188 lb 8 oz (85.5 kg)   BMI 34.48 kg/m   Physical Exam  NAD HEENT:  PERRL, EOMI, conjunctivae without injection.  Temporal arteries with good pulses and elasticity.  NT over arteries.  MMM Neck:  Supple, No adenopathy, no thyromegaly Chest:  CTA CV:  RRR without murmur or rub, Radial pulses normal and equal. MS:  No erythema, synovial thickening, or palpable effusion of any joint.  Good ROM of all joints.  She has point tenderness at all but one area (left low back) for Fibromyalgia. Unable to walk on flat foot--toe walks--as has pain in low back when puts heel to floor bilaterally.  No obvious shoulder or pelvic girdle stiffness when moves about.  Actually moves fluently to exam bed after sitting for some time in chair. Wearing shoes with 2-3 inch heels and very thin sole of shoe for the forefoot.  Assessment & Plan  1.  Diffuse body pain/joint and soft tissue pain:  ANA, Sed Rate, CRP, CBC, CMP, TSH.  Doubt PMR from exam.  She did have a course of Prednisone in February with neck pain, but cannot recall if helped with her diffuse complaints. Referral to PT as  suspect her chronic wearing of high heels does not help. If labs do not identify a problem, suspect Fibromyalgia and will start her Cymbalta 30 mg daily to increase to 60 mg daily on day 4 of use.  She will call when she starts the medication and follow up 2 weeks later.  2.  HM:  Rx for Flu vaccine at [facility]

## 2019-11-14 LAB — RHEUMATOID FACTOR: Rheumatoid fact SerPl-aCnc: <10 IU/mL (ref 0.0–13.9)

## 2019-11-14 LAB — COMPREHENSIVE METABOLIC PANEL
ALT: 27 IU/L (ref 0–32)
AST: 25 IU/L (ref 0–40)
Albumin/Globulin Ratio: 1.9 (ref 1.2–2.2)
Albumin: 4.6 g/dL (ref 3.8–4.9)
Alkaline Phosphatase: 96 IU/L (ref 44–121)
BUN/Creatinine Ratio: 17 (ref 9–23)
BUN: 14 mg/dL (ref 6–24)
Bilirubin Total: 0.4 mg/dL (ref 0.0–1.2)
CO2: 20 mmol/L (ref 20–29)
Calcium: 9.6 mg/dL (ref 8.7–10.2)
Chloride: 102 mmol/L (ref 96–106)
Creatinine, Ser: 0.81 mg/dL (ref 0.57–1.00)
GFR calc Af Amer: 93 mL/min/{1.73_m2} (ref >59)
GFR calc non Af Amer: 81 mL/min/{1.73_m2} (ref >59)
Globulin, Total: 2.4 g/dL (ref 1.5–4.5)
Glucose: 97 mg/dL (ref 65–99)
Potassium: 3.7 mmol/L (ref 3.5–5.2)
Sodium: 139 mmol/L (ref 134–144)
Total Protein: 7 g/dL (ref 6.0–8.5)

## 2019-11-14 LAB — CBC WITH DIFFERENTIAL/PLATELET
Basophils Absolute: 0 10*3/uL (ref 0.0–0.2)
Basos: 1 %
EOS (ABSOLUTE): 0.2 10*3/uL (ref 0.0–0.4)
Eos: 3 %
Hematocrit: 40.2 % (ref 34.0–46.6)
Hemoglobin: 13.6 g/dL (ref 11.1–15.9)
Immature Grans (Abs): 0 10*3/uL (ref 0.0–0.1)
Immature Granulocytes: 0 %
Lymphocytes Absolute: 2.6 10*3/uL (ref 0.7–3.1)
Lymphs: 41 %
MCH: 31.7 pg (ref 26.6–33.0)
MCHC: 33.8 g/dL (ref 31.5–35.7)
MCV: 94 fL (ref 79–97)
Monocytes Absolute: 0.6 10*3/uL (ref 0.1–0.9)
Monocytes: 9 %
Neutrophils Absolute: 2.8 10*3/uL (ref 1.4–7.0)
Neutrophils: 46 %
Platelets: 217 10*3/uL (ref 150–450)
RBC: 4.29 x10E6/uL (ref 3.77–5.28)
RDW: 13.3 % (ref 11.7–15.4)
WBC: 6.2 10*3/uL (ref 3.4–10.8)

## 2019-11-14 LAB — HIGH SENSITIVITY CRP: CRP, High Sensitivity: 2.06 mg/L (ref 0.00–3.00)

## 2019-11-14 LAB — SPECIMEN STATUS REPORT

## 2019-11-14 LAB — SEDIMENTATION RATE: Sed Rate: 7 mm/hr (ref 0–40)

## 2019-11-14 LAB — ANA: Anti Nuclear Antibody (ANA): NEGATIVE

## 2019-11-14 LAB — TSH: TSH: 2.62 u[IU]/mL (ref 0.450–4.500)

## 2019-12-03 ENCOUNTER — Ambulatory Visit: Payer: Self-pay | Admitting: Internal Medicine

## 2019-12-10 ENCOUNTER — Ambulatory Visit: Payer: Self-pay | Admitting: Internal Medicine

## 2019-12-19 ENCOUNTER — Telehealth: Payer: Self-pay | Admitting: Internal Medicine

## 2019-12-19 NOTE — Telephone Encounter (Signed)
Result Notes    Spoke to patient today and states she picked up medication from the [facility] pharmacy and she started Cymbalta  2 Weeks ago. And did not call to inform us so we could set up appointment.     Vanessa Mcdaniel  12/17/2019 4:50 PM EST Back to Top  Lvm for pt return call on 12/17/2019  Julieanne Manson, MD  12/16/2019 9:18 PM EST   Notify her labs do not support an inflammatory arthritis such as rheumatoid arthritis.  Has she started the Cymbalta I prescribed at her visit? She was to apply for MAP for this med at [facility] and call when she got started so I could see her 1 week later.

## 2020-01-15 ENCOUNTER — Other Ambulatory Visit: Payer: Self-pay | Admitting: Internal Medicine

## 2020-02-03 ENCOUNTER — Emergency Department (HOSPITAL_COMMUNITY): Payer: Self-pay

## 2020-02-03 ENCOUNTER — Encounter (HOSPITAL_COMMUNITY): Payer: Self-pay

## 2020-02-03 ENCOUNTER — Emergency Department (HOSPITAL_COMMUNITY)
Admission: EM | Admit: 2020-02-03 | Discharge: 2020-02-03 | Disposition: A | Discharge status: Home / Self Care | Payer: Self-pay | Attending: Emergency Medicine | Admitting: Emergency Medicine

## 2020-02-03 ENCOUNTER — Other Ambulatory Visit: Payer: Self-pay

## 2020-02-03 DIAGNOSIS — R079 Chest pain, unspecified: Secondary | ICD-10-CM | POA: Insufficient documentation

## 2020-02-03 DIAGNOSIS — R531 Weakness: Secondary | ICD-10-CM

## 2020-02-03 DIAGNOSIS — R5381 Other malaise: Secondary | ICD-10-CM | POA: Insufficient documentation

## 2020-02-03 DIAGNOSIS — Z20822 Contact with and (suspected) exposure to covid-19: Secondary | ICD-10-CM | POA: Insufficient documentation

## 2020-02-03 DIAGNOSIS — I10 Essential (primary) hypertension: Secondary | ICD-10-CM | POA: Insufficient documentation

## 2020-02-03 LAB — COMPREHENSIVE METABOLIC PANEL
ALT: 29 U/L (ref 0–44)
AST: 29 U/L (ref 15–41)
Albumin: 4.5 g/dL (ref 3.5–5.0)
Alkaline Phosphatase: 79 U/L (ref 38–126)
Anion gap: 11 (ref 5–15)
BUN: 10 mg/dL (ref 6–20)
CO2: 22 mmol/L (ref 22–32)
Calcium: 9.6 mg/dL (ref 8.9–10.3)
Chloride: 106 mmol/L (ref 98–111)
Creatinine, Ser: 0.77 mg/dL (ref 0.44–1.00)
GFR, Estimated: >60 mL/min (ref >60)
Glucose, Bld: 103 mg/dL — ABNORMAL HIGH (ref 70–99)
Potassium: 3.9 mmol/L (ref 3.5–5.1)
Sodium: 139 mmol/L (ref 135–145)
Total Bilirubin: 0.6 mg/dL (ref 0.3–1.2)
Total Protein: 8 g/dL (ref 6.5–8.1)

## 2020-02-03 LAB — CBC
HCT: 41.4 % (ref 36.0–46.0)
Hemoglobin: 14.3 g/dL (ref 12.0–15.0)
MCH: 31.5 pg (ref 26.0–34.0)
MCHC: 34.5 g/dL (ref 30.0–36.0)
MCV: 91.2 fL (ref 80.0–100.0)
Platelets: 231 10*3/uL (ref 150–400)
RBC: 4.54 MIL/uL (ref 3.87–5.11)
RDW: 12.4 % (ref 11.5–15.5)
WBC: 6.9 10*3/uL (ref 4.0–10.5)
nRBC: 0 % (ref 0.0–0.2)

## 2020-02-03 LAB — RESP PANEL BY RT-PCR (FLU A&B, COVID) ARPGX2
Influenza A by PCR: NEGATIVE
Influenza B by PCR: NEGATIVE
SARS Coronavirus 2 by RT PCR: NEGATIVE

## 2020-02-03 LAB — TROPONIN I (HIGH SENSITIVITY): Troponin I (High Sensitivity): 2 ng/L (ref <18)

## 2020-02-03 MED ORDER — ALUM & MAG HYDROXIDE-SIMETH 200-200-20 MG/5ML PO SUSP
30.0000 mL | Freq: Once | ORAL | Status: DC
  Filled 2020-02-03: qty 30

## 2020-02-03 MED ORDER — OMEPRAZOLE 20 MG PO CPDR: 20.0000 mg | DELAYED_RELEASE_CAPSULE | Freq: Every day | ORAL | 1 refills | Status: AC

## 2020-02-03 NOTE — ED Triage Notes (Signed)
Pt to er, computer interpreter used, [id number]  Pt states that she is here for general weakness, states that her eyes feel heavy, and she has a sore throat.  Pt states that she has been feeling poorly for the past week.  States that three days ago it has been worse and her arms feel cold.  Denies cough.

## 2020-02-03 NOTE — ED Notes (Signed)
ED Provider at bedside. 
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
## 2020-02-03 NOTE — Discharge Instructions (Addendum)
You were evaluated in the Emergency Department and after careful evaluation, we did not find any emergent condition requiring admission or further testing in the hospital.  Your exam/testing today was overall reassuring.  Your symptoms seem to be due to acid reflux.  Please take the omeprazole medication as directed and follow up with your regular doctor.  Please return to the Emergency Department if you experience any worsening of your condition.  Thank you for allowing [place] to be a part of your care.

## 2022-02-04 IMAGING — DX DG CERVICAL SPINE COMPLETE 4+V
6 series · 6 of 6 positions shown · non-contrast
Comparison: None.

CLINICAL DATA: Neck pain and heaviness.

EXAM:
CERVICAL SPINE - COMPLETE 4+ VIEW

[c-spine lat]
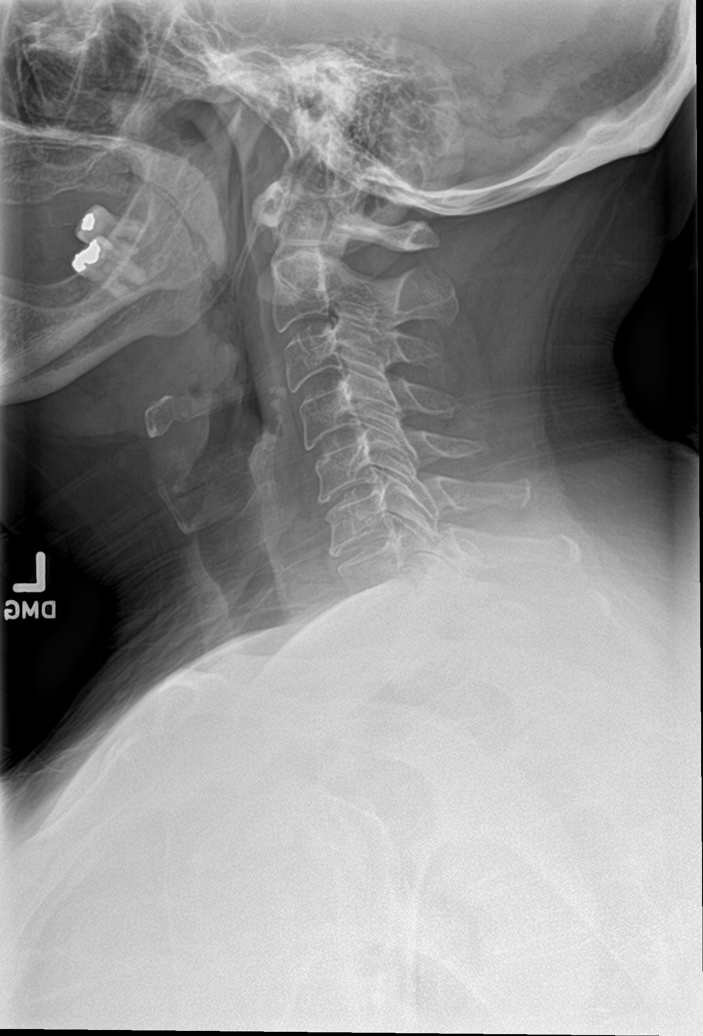

[c-spine obl (1 of 2)]
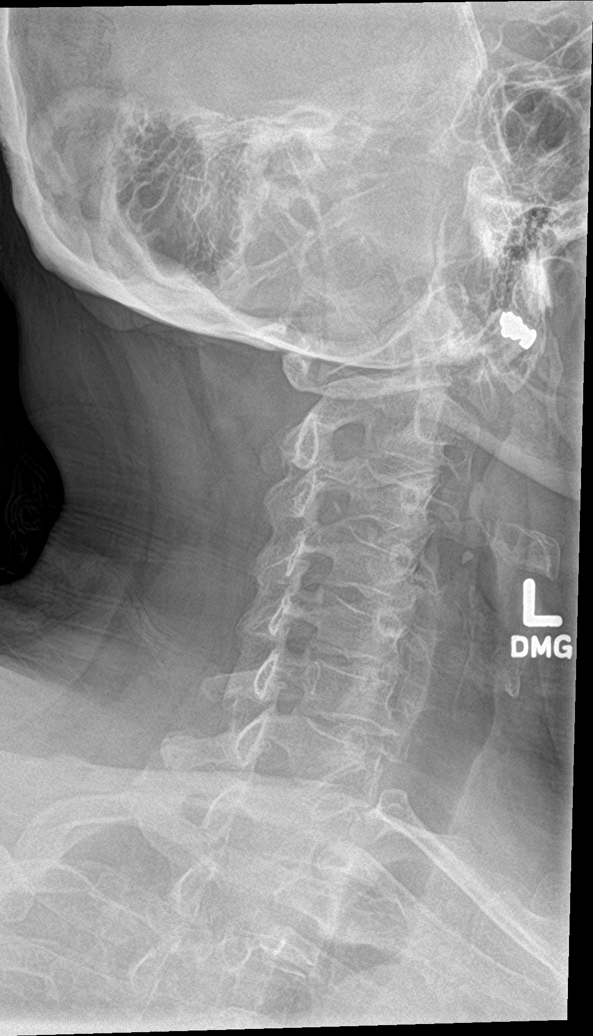

[c-spine obl (2 of 2)]
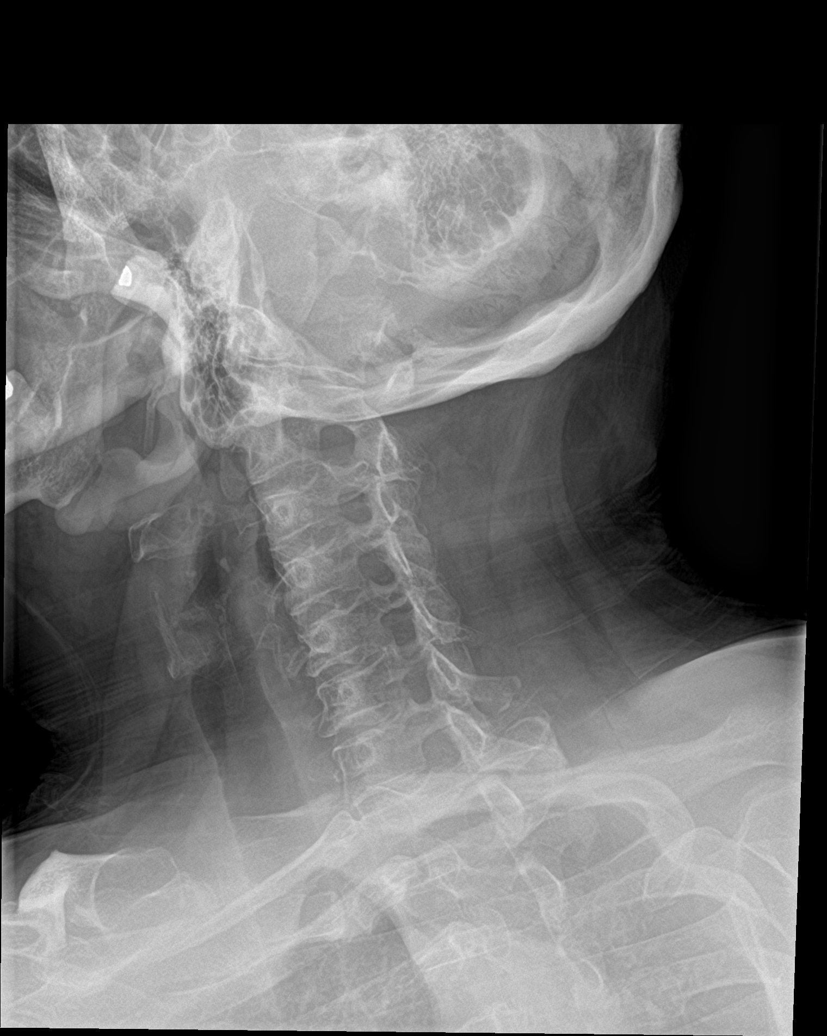

[c-spine ap]
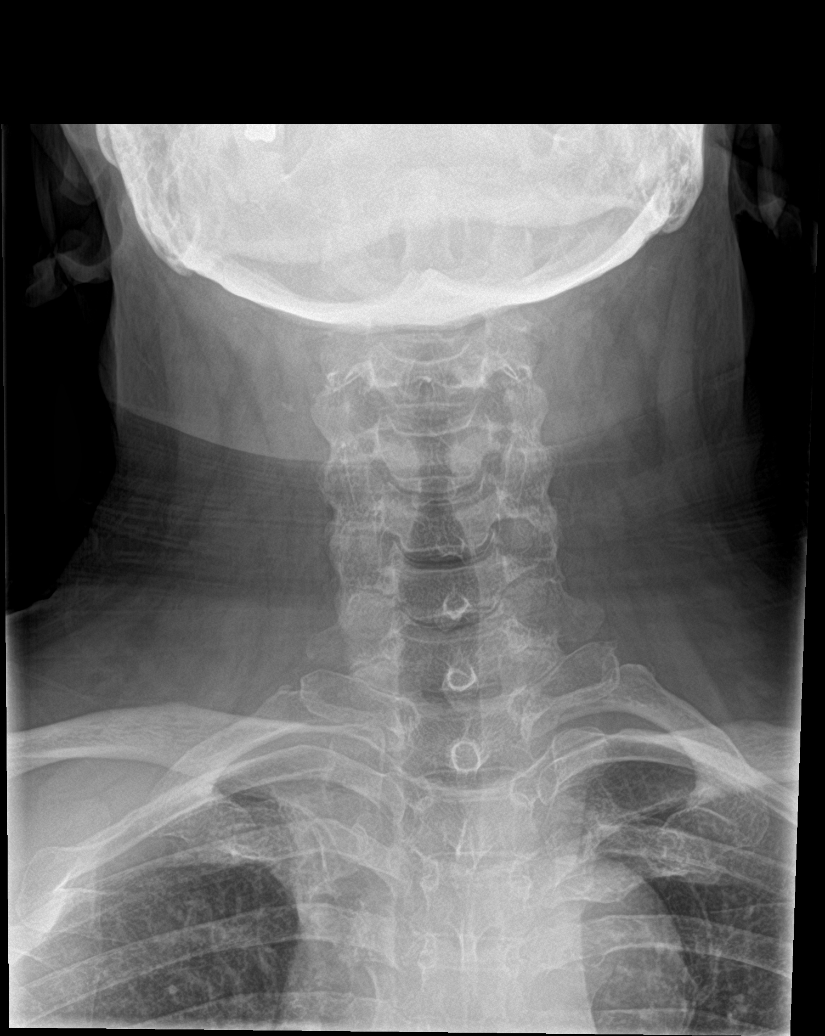

[c-spine open mouth (1 of 2)]
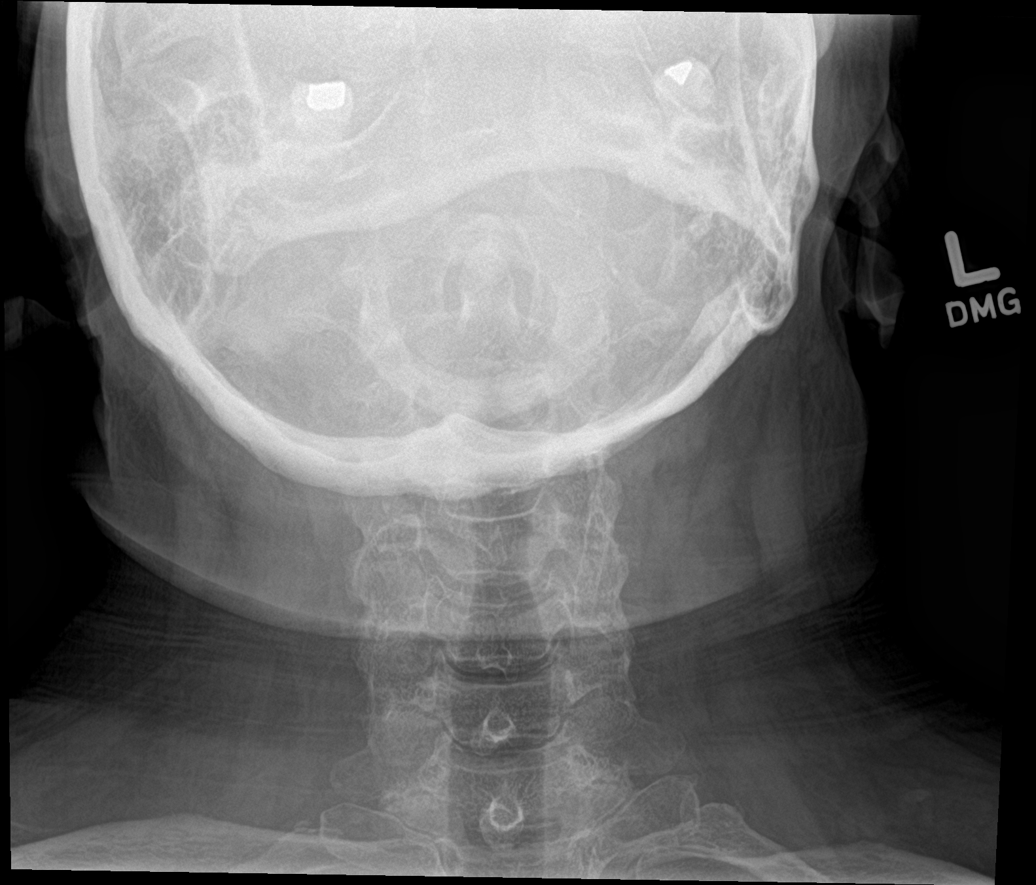

[c-spine open mouth (2 of 2)]
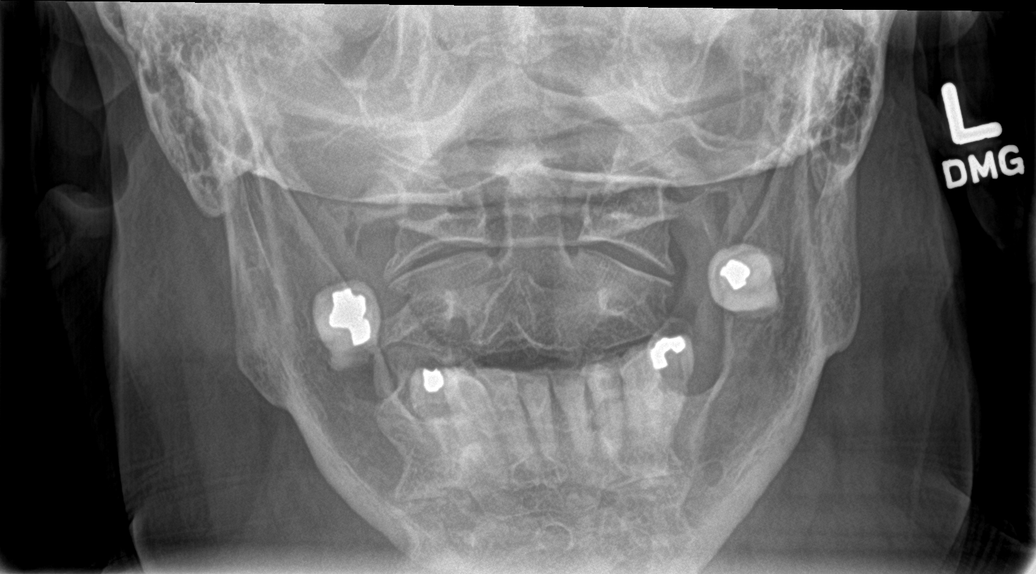

[6 of 6 positions shown; findings below may reference images not displayed]

FINDINGS: Normal alignment. No disc space narrowing. No facet arthropathy. No
osteophytic encroachment upon the canal or foramina. No focal
lesion. Soft tissue shadows are normal.
IMPRESSION: Normal

## 2022-12-23 IMAGING — DX DG CHEST 1V PORT
1 series · 1 of 1 positions shown · non-contrast
Comparison: None.

CLINICAL DATA: Chest pain with weakness

EXAM:
PORTABLE CHEST 1 VIEW

[chest ap]
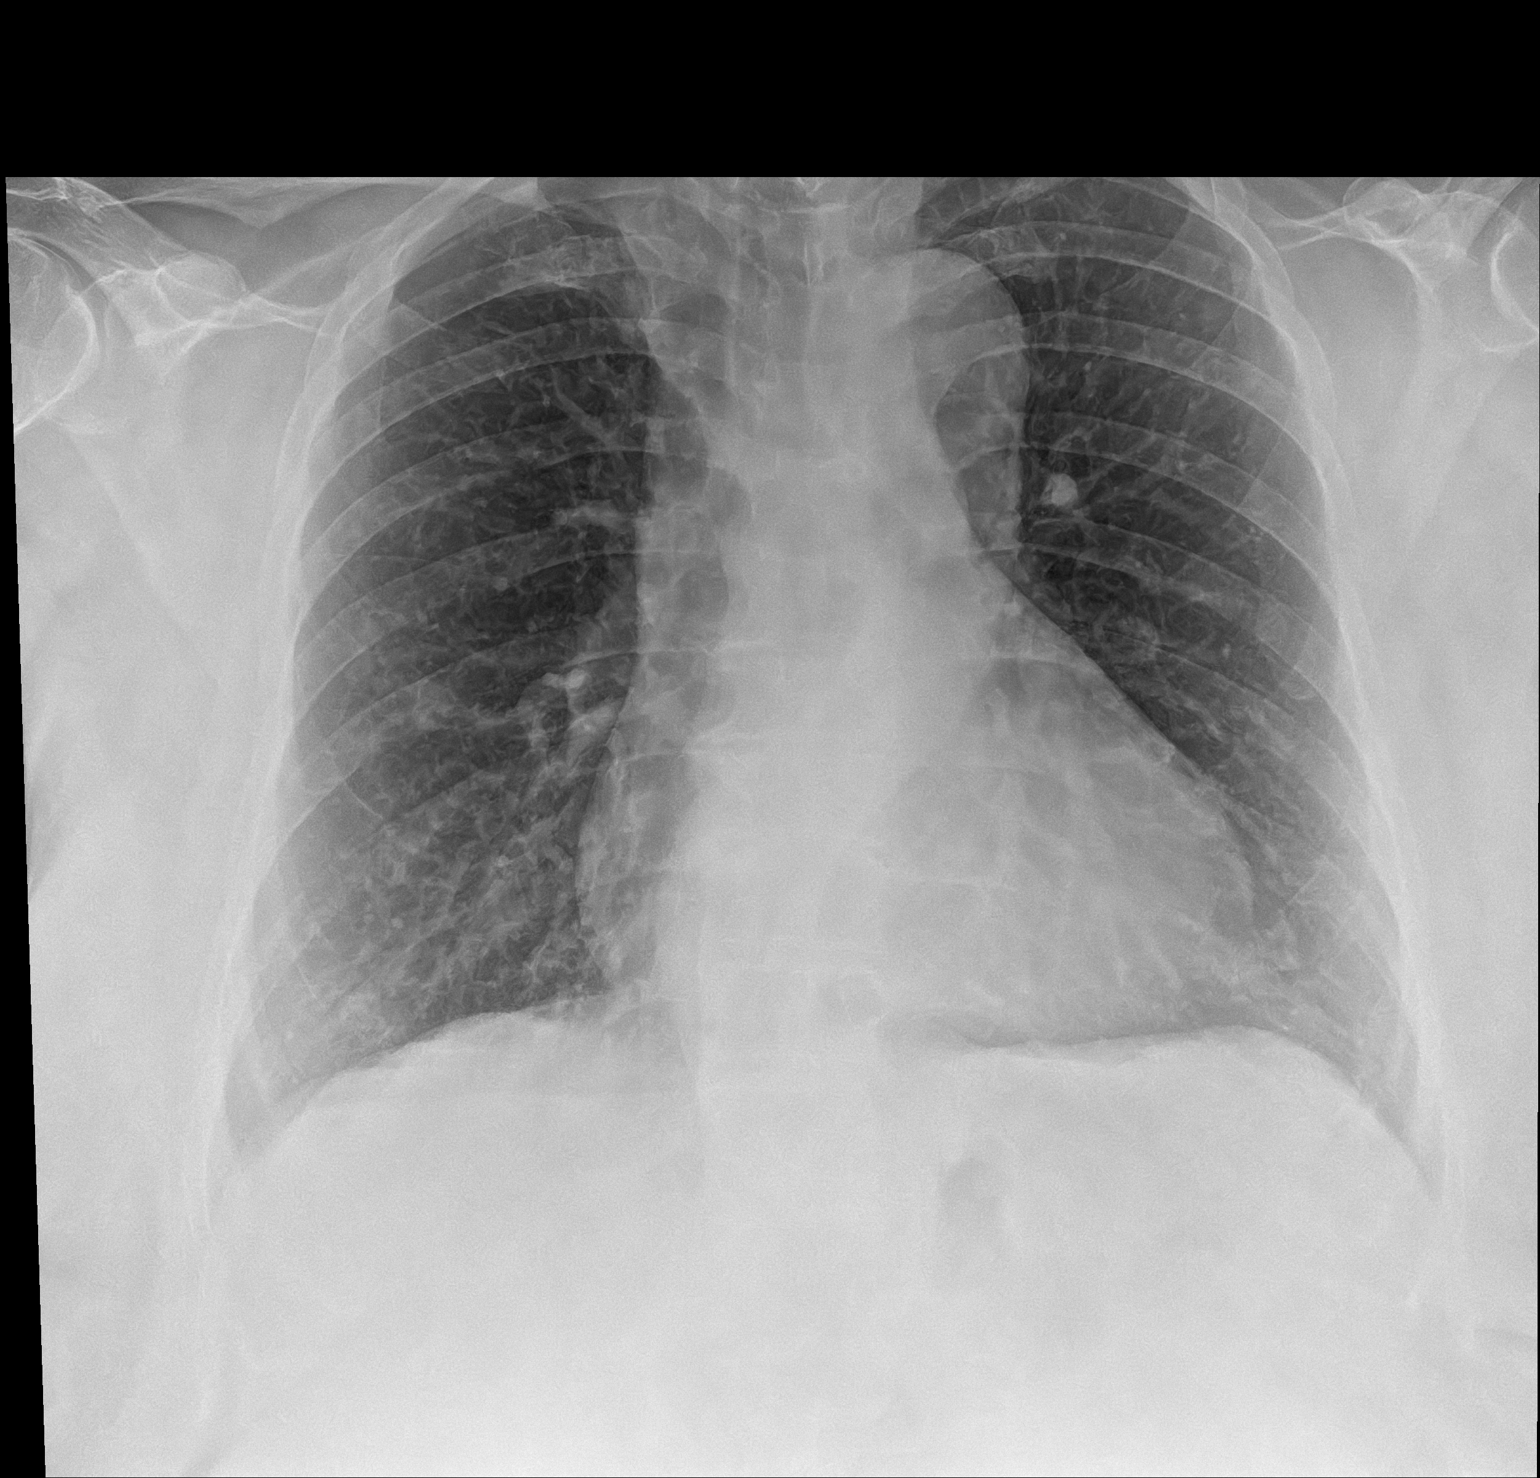

[1 of 1 positions shown; findings below may reference images not displayed]

FINDINGS: Normal cardiac silhouette ectatic aorta. Chronic bronchitic markings
in the lungs. No acute infiltrate pleural fluid. No pneumothorax or
pulmonary edema.
IMPRESSION: 1. No acute cardiopulmonary findings.
2. Chronic bronchitic markings.
